# Patient Record
Sex: Female | Born: 1985 | Hispanic: Yes | Marital: Married
Health system: Southern US, Community
[De-identification: ages and names within clinical notes are randomized; demographics above are authoritative.]

---

## 2014-02-19 HISTORY — PX: OTHER SURGICAL HISTORY: SHX169

## 2016-01-31 LAB — OB RESULTS CONSOLE HEPATITIS B SURFACE ANTIGEN: Hepatitis B Surface Ag: NEGATIVE

## 2016-01-31 LAB — OB RESULTS CONSOLE ANTIBODY SCREEN: Antibody Screen: NEGATIVE

## 2016-01-31 LAB — OB RESULTS CONSOLE RUBELLA ANTIBODY, IGM: Rubella: IMMUNE

## 2016-01-31 LAB — OB RESULTS CONSOLE ABO/RH: RH Type: POSITIVE

## 2016-01-31 LAB — OB RESULTS CONSOLE GC/CHLAMYDIA
CHLAMYDIA, DNA PROBE: NEGATIVE
GC PROBE AMP, GENITAL: NEGATIVE

## 2016-01-31 LAB — OB RESULTS CONSOLE HIV ANTIBODY (ROUTINE TESTING): HIV: NONREACTIVE

## 2016-01-31 LAB — OB RESULTS CONSOLE RPR: RPR: NONREACTIVE

## 2016-02-20 NOTE — L&D Delivery Note (Signed)
/   Delivery Note I was called to attend this delivery due to rapid progression.  I arrived just as the baby was being delivered, en caul;At 7:38 AM a viable female was delivered via  (Presentation: ;LOA  ).  APGAR: , ; weight  .pending  After 1 minute, the cord was clamped and cut. 40 units of pitocin diluted in 1000cc LR was infused rapidly IV.  The placenta separated spontaneously and delivered via CCT and maternal pushing effort.  It was inspected and appears to be intact with a 3 VC   Anesthesia:  none Episiotomy:   Lacerations:  none Suture Repair:  Est. Blood Loss (mL):    Mom to postpartum.  Baby to Couplet care / Skin to Skin.  CRESENZO-DISHMAN,Tevyn Codd 08/17/2016, 7:44 AM  .

## 2016-03-30 ENCOUNTER — Other Ambulatory Visit: Payer: Self-pay

## 2016-05-02 ENCOUNTER — Other Ambulatory Visit (HOSPITAL_COMMUNITY): Payer: Self-pay | Admitting: Obstetrics & Gynecology

## 2016-05-02 DIAGNOSIS — Z3A25 25 weeks gestation of pregnancy: Secondary | ICD-10-CM

## 2016-05-02 DIAGNOSIS — Z3689 Encounter for other specified antenatal screening: Secondary | ICD-10-CM

## 2016-05-09 ENCOUNTER — Other Ambulatory Visit (HOSPITAL_COMMUNITY): Payer: Self-pay | Admitting: Obstetrics & Gynecology

## 2016-05-09 ENCOUNTER — Ambulatory Visit (HOSPITAL_COMMUNITY)
Admission: RE | Admit: 2016-05-09 | Discharge: 2016-05-09 | Disposition: A | Discharge status: Home / Self Care | Payer: Medicaid Other | Source: Ambulatory Visit | Attending: Obstetrics & Gynecology | Admitting: Obstetrics & Gynecology

## 2016-05-09 DIAGNOSIS — O99212 Obesity complicating pregnancy, second trimester: Secondary | ICD-10-CM | POA: Diagnosis not present

## 2016-05-09 DIAGNOSIS — O99842 Bariatric surgery status complicating pregnancy, second trimester: Secondary | ICD-10-CM

## 2016-05-09 DIAGNOSIS — Z3A25 25 weeks gestation of pregnancy: Secondary | ICD-10-CM | POA: Diagnosis not present

## 2016-05-09 DIAGNOSIS — Z3689 Encounter for other specified antenatal screening: Secondary | ICD-10-CM

## 2016-05-09 DIAGNOSIS — O09212 Supervision of pregnancy with history of pre-term labor, second trimester: Secondary | ICD-10-CM

## 2016-05-09 IMAGING — US US MFM OB DETAIL+14 WK
1 series · 14 of 28 positions shown · non-contrast
Comparison: none

[Series 1: us mfm ob detail+14 wk · 14 of 86 slices shown]
[im 4/86]
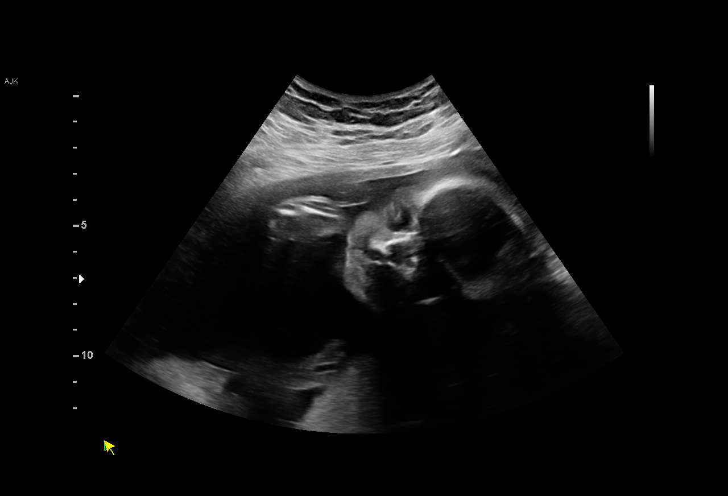
[im 10/86]
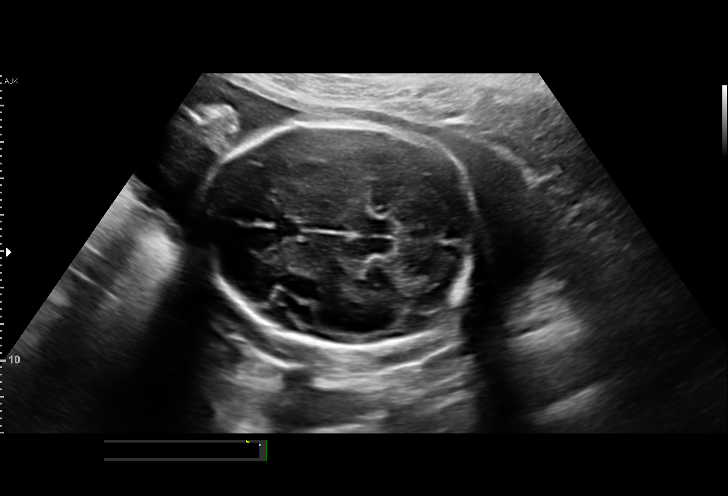
[im 16/86]
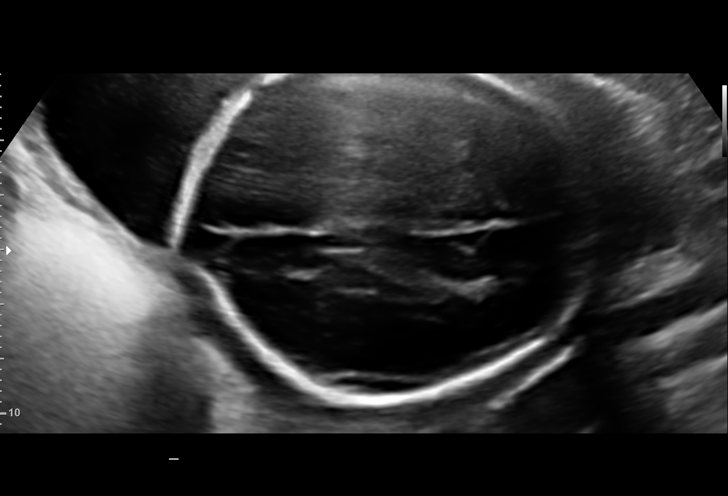
[im 23/86]
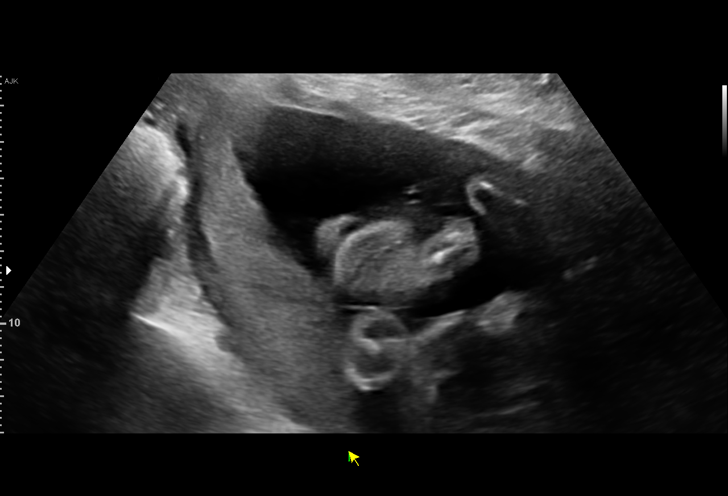
[im 29/86]
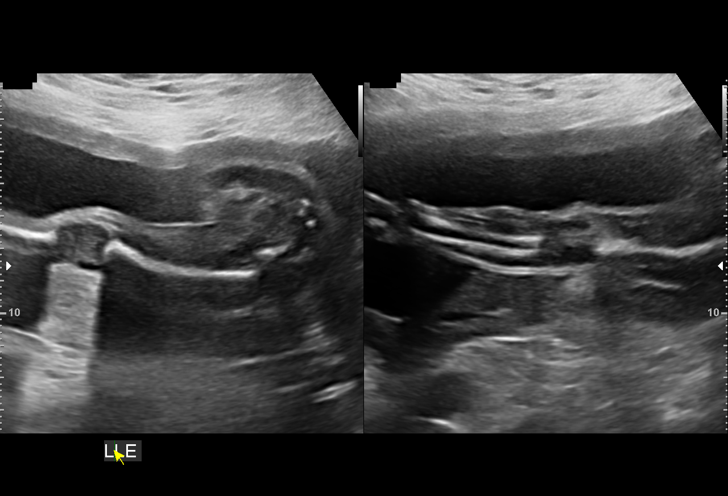
[im 35/86]
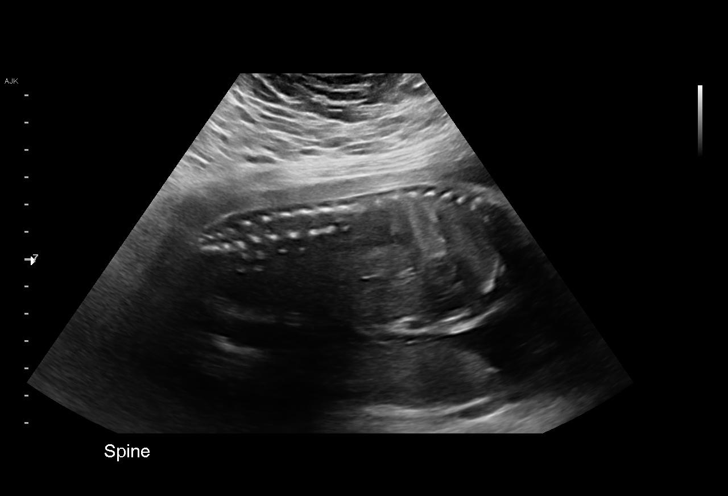
[im 41/86]
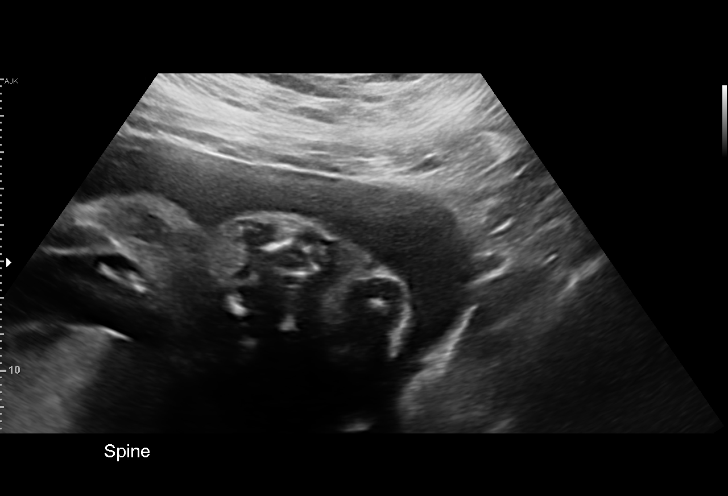
[im 48/86]
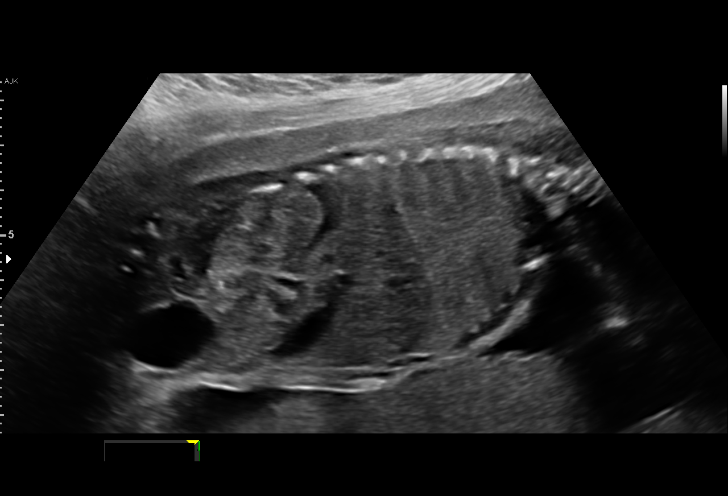
[im 54/86]
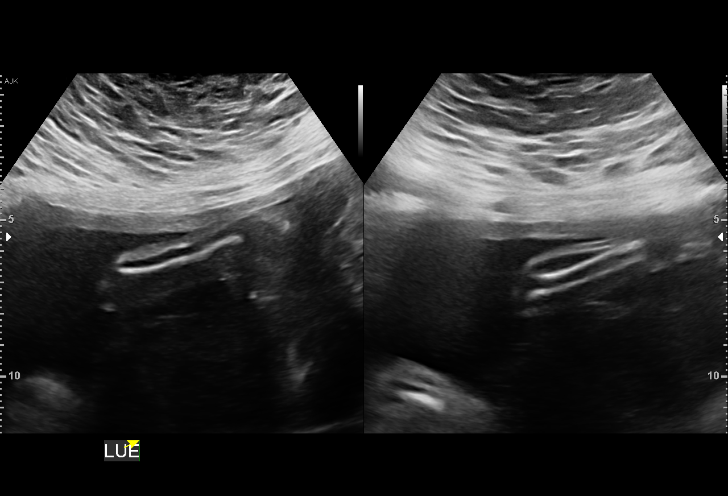
[im 60/86]
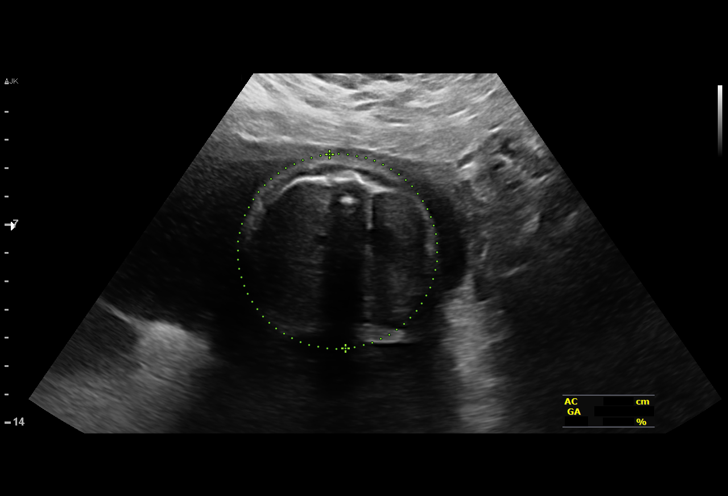
[im 67/86]
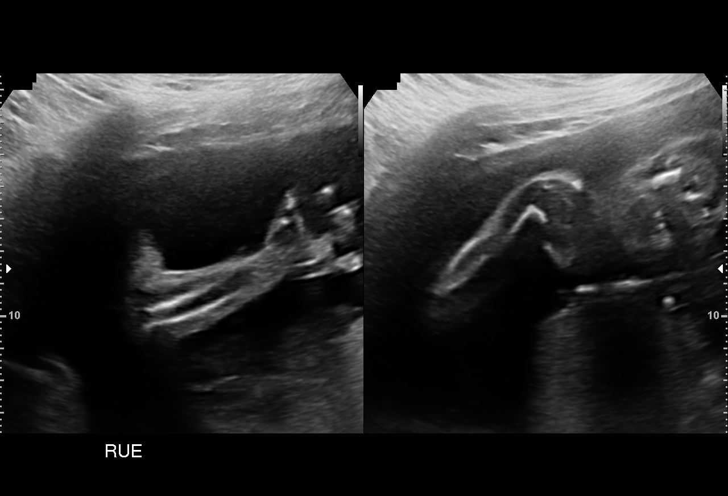
[im 73/86]
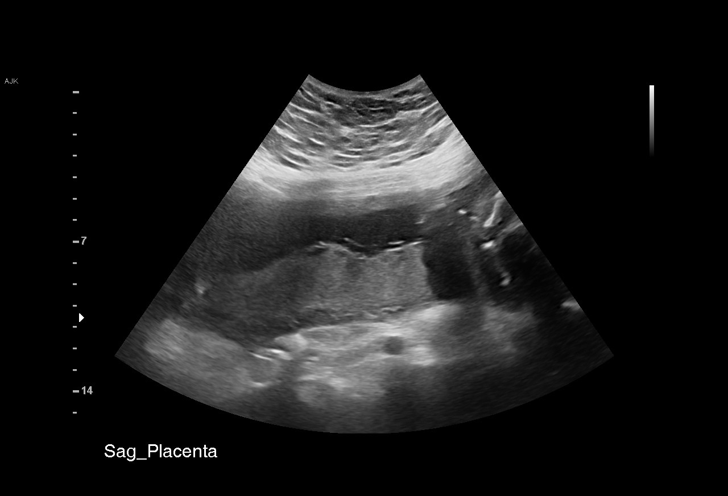
[im 79/86]
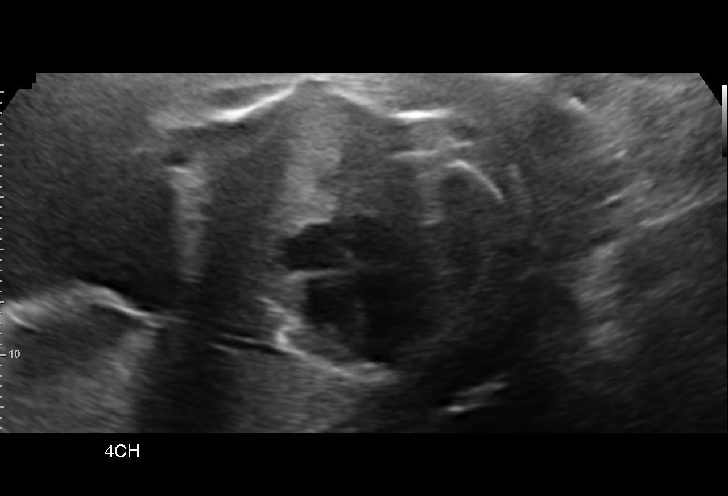
[im 86/86]
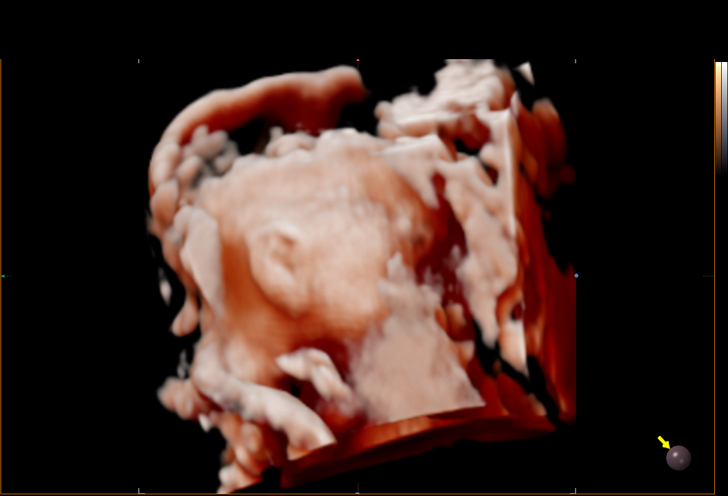

[14 of 28 positions shown; findings below may reference images not displayed]

[WV]

1  YF            [PHONE_NUMBER]      [PHONE_NUMBER]     [PHONE_NUMBER]
Indications

25 weeks gestation of pregnancy
Encounter for fetal anatomic survey            [WV]
Poor obstetric history: Previous preterm       [WV]
delivery, antepartum (36 weeks)
Obesity complicating pregnancy, second         [WV]
trimester
Pregnancy complicated by previous gastric      [WV]
bypass, antepartum, second trimester
OB History

Blood Type:            Height:  5'6"   Weight (lb):  262      BMI:
Gravidity:    5         Term:   1        Prem:   1        SAB:   1
Living:       3
Fetal Evaluation

Num Of Fetuses:     1
Fetal Heart         146
Rate(bpm):
Cardiac Activity:   Observed
Presentation:       Cephalic
Placenta:           Posterior, above cervical os
P. Cord Insertion:  Visualized, central

Amniotic Fluid
AFI FV:      Subjectively within normal limits

Largest Pocket(cm)
3.59
Biometry

BPD:      63.2  mm     G. Age:  25w 4d         31  %    CI:        73.73   %   70 - 86
FL/HC:      20.1   %   18.6 -
HC:      233.8  mm     G. Age:  25w 3d         15  %    HC/AC:      1.08       1.04 -
AC:      216.3  mm     G. Age:  26w 1d         49  %    FL/BPD:     74.2   %   71 - 87
FL:       46.9  mm     G. Age:  25w 5d         29  %    FL/AC:      21.7   %   20 - 24
CER:      32.6  mm     G. Age:  28w 4d         94  %

Est. FW:     859  gm    1 lb 14 oz      54  %
Gestational Age

U/S Today:     25w 5d                                        EDD:   [DATE]
Best:          25w 6d    Det. By:   Previous Ultrasound      EDD:   [DATE]
([DATE])
Anatomy

Cranium:               Appears normal         Aortic Arch:            Appears normal
Cavum:                 Appears normal         Ductal Arch:            Not well visualized
Ventricles:            Appears normal         Diaphragm:              Appears normal
Choroid Plexus:        Appears normal         Stomach:                Appears normal, left
sided
Cerebellum:            Appears normal         Abdomen:                Appears normal
Posterior Fossa:       Appears normal         Abdominal Wall:         Appears nml (cord
insert, abd wall)
Nuchal Fold:           Not applicable (>20    Cord Vessels:           Appears normal (3
wks GA)                                        vessel cord)
Face:                  Appears normal         Kidneys:                Appear normal
(orbits and profile)
Lips:                  Appears normal         Bladder:                Appears normal
Thoracic:              Appears normal         Spine:                  Appears normal
Heart:                 Appears normal         Upper Extremities:      Appears normal
(4CH, axis, and situs
RVOT:                  Not well visualized    Lower Extremities:      Appears normal
LVOT:                  Appears normal

Other:  Fetus appears to be a female. Nasal bone visualized. Technically
difficult due to maternal habitus and fetal position.
Cervix Uterus Adnexa

Cervix
Length:           3.73  cm.
Normal appearance by transabdominal scan.

Uterus
No abnormality visualized.

Left Ovary
Not visualized.

Right Ovary
Not visualized.

Adnexa:       No abnormality visualized.
Impression

SIUP at 25+6 weeks
Normal detailed fetal anatomy; limited views of RVOT and the
DA
Normal amniotic fluid volume
Measurements consistent with a prior US; EFW at the 54th
%tile
Recommendations

Serial ultrasounds for growth
We would be pleased to perform these exams.  If desired,
call to schedule.

## 2016-07-19 LAB — OB RESULTS CONSOLE GBS: STREP GROUP B AG: NEGATIVE

## 2016-08-06 ENCOUNTER — Other Ambulatory Visit: Payer: Self-pay | Admitting: Obstetrics & Gynecology

## 2016-08-07 ENCOUNTER — Encounter (HOSPITAL_COMMUNITY): Payer: Self-pay | Admitting: *Deleted

## 2016-08-07 ENCOUNTER — Telehealth (HOSPITAL_COMMUNITY): Payer: Self-pay | Admitting: *Deleted

## 2016-08-07 NOTE — Telephone Encounter (Signed)
Preadmission screen  

## 2016-08-13 ENCOUNTER — Inpatient Hospital Stay (HOSPITAL_COMMUNITY): Admission: RE | Admit: 2016-08-13 | Payer: Medicaid Other | Source: Ambulatory Visit

## 2016-08-16 DIAGNOSIS — Z3A40 40 weeks gestation of pregnancy: Secondary | ICD-10-CM

## 2016-08-17 ENCOUNTER — Encounter (HOSPITAL_COMMUNITY): Payer: Self-pay

## 2016-08-17 ENCOUNTER — Inpatient Hospital Stay (HOSPITAL_COMMUNITY)
Admission: AD | Admit: 2016-08-17 | Discharge: 2016-08-18 | DRG: 775 | Disposition: A | Discharge status: Home / Self Care | Payer: Medicaid Other | Source: Ambulatory Visit | Attending: Obstetrics & Gynecology | Admitting: Obstetrics & Gynecology

## 2016-08-17 DIAGNOSIS — Z3493 Encounter for supervision of normal pregnancy, unspecified, third trimester: Secondary | ICD-10-CM | POA: Diagnosis present

## 2016-08-17 DIAGNOSIS — Z3A39 39 weeks gestation of pregnancy: Secondary | ICD-10-CM

## 2016-08-17 DIAGNOSIS — Z3A40 40 weeks gestation of pregnancy: Secondary | ICD-10-CM | POA: Diagnosis not present

## 2016-08-17 LAB — CBC
HCT: 36.6 % (ref 36.0–46.0)
HEMOGLOBIN: 12.4 g/dL (ref 12.0–15.0)
MCH: 30.2 pg (ref 26.0–34.0)
MCHC: 33.9 g/dL (ref 30.0–36.0)
MCV: 89.1 fL (ref 78.0–100.0)
PLATELETS: 278 10*3/uL (ref 150–400)
RBC: 4.11 MIL/uL (ref 3.87–5.11)
RDW: 13.1 % (ref 11.5–15.5)
WBC: 11.1 10*3/uL — AB (ref 4.0–10.5)

## 2016-08-17 LAB — ABO/RH: ABO/RH(D): O POS

## 2016-08-17 LAB — TYPE AND SCREEN
ABO/RH(D): O POS
ANTIBODY SCREEN: NEGATIVE

## 2016-08-17 LAB — RPR: RPR: NONREACTIVE

## 2016-08-17 MED ORDER — SIMETHICONE 80 MG PO CHEW: 80.0000 mg | CHEWABLE_TABLET | ORAL | Status: DC | PRN

## 2016-08-17 MED ORDER — BENZOCAINE-MENTHOL 20-0.5 % EX AERO
1.0000 | INHALATION_SPRAY | CUTANEOUS | Status: DC | PRN
Start: 2016-08-17 — End: 2016-08-18

## 2016-08-17 MED ORDER — ONDANSETRON HCL 4 MG/2ML IJ SOLN: 4.0000 mg | INTRAMUSCULAR | Status: DC | PRN

## 2016-08-17 MED ORDER — OXYTOCIN BOLUS FROM INFUSION
500.0000 mL | Freq: Once | INTRAVENOUS | Status: AC
  Administered 2016-08-17: 500 mL via INTRAVENOUS

## 2016-08-17 MED ORDER — PRENATAL MULTIVITAMIN CH
1.0000 | ORAL_TABLET | Freq: Every day | ORAL | Status: DC
  Administered 2016-08-17 – 2016-08-18 (×2): 1 via ORAL
  Filled 2016-08-17 (×2): qty 1

## 2016-08-17 MED ORDER — COCONUT OIL OIL: 1.0000 "application " | TOPICAL_OIL | Status: DC | PRN

## 2016-08-17 MED ORDER — DIPHENHYDRAMINE HCL 25 MG PO CAPS: 25.0000 mg | ORAL_CAPSULE | Freq: Four times a day (QID) | ORAL | Status: DC | PRN

## 2016-08-17 MED ORDER — LIDOCAINE HCL (PF) 1 % IJ SOLN
30.0000 mL | INTRAMUSCULAR | Status: DC | PRN
Start: 2016-08-17 — End: 2016-08-18
  Filled 2016-08-17: qty 30

## 2016-08-17 MED ORDER — LACTATED RINGERS IV SOLN
INTRAVENOUS | Status: DC
  Administered 2016-08-17: 01:00:00 via INTRAVENOUS

## 2016-08-17 MED ORDER — OXYCODONE-ACETAMINOPHEN 5-325 MG PO TABS
2.0000 | ORAL_TABLET | ORAL | Status: DC | PRN
  Administered 2016-08-17 – 2016-08-18 (×5): 2 via ORAL
  Filled 2016-08-17 (×6): qty 2

## 2016-08-17 MED ORDER — OXYTOCIN 40 UNITS IN LACTATED RINGERS INFUSION - SIMPLE MED
2.5000 [IU]/h | INTRAVENOUS | Status: DC
  Filled 2016-08-17: qty 1000

## 2016-08-17 MED ORDER — OXYCODONE-ACETAMINOPHEN 5-325 MG PO TABS
1.0000 | ORAL_TABLET | ORAL | Status: DC | PRN
  Filled 2016-08-17: qty 1

## 2016-08-17 MED ORDER — DIBUCAINE 1 % RE OINT: 1.0000 "application " | TOPICAL_OINTMENT | RECTAL | Status: DC | PRN

## 2016-08-17 MED ORDER — ONDANSETRON HCL 4 MG PO TABS: 4.0000 mg | ORAL_TABLET | ORAL | Status: DC | PRN

## 2016-08-17 MED ORDER — ZOLPIDEM TARTRATE 5 MG PO TABS: 5.0000 mg | ORAL_TABLET | Freq: Every evening | ORAL | Status: DC | PRN

## 2016-08-17 MED ORDER — LACTATED RINGERS IV SOLN: 500.0000 mL | INTRAVENOUS | Status: DC | PRN

## 2016-08-17 MED ORDER — TERBUTALINE SULFATE 1 MG/ML IJ SOLN
0.2500 mg | Freq: Once | INTRAMUSCULAR | Status: DC | PRN
Start: 2016-08-17 — End: 2016-08-18
  Filled 2016-08-17: qty 1

## 2016-08-17 MED ORDER — TETANUS-DIPHTH-ACELL PERTUSSIS 5-2.5-18.5 LF-MCG/0.5 IM SUSP: 0.5000 mL | Freq: Once | INTRAMUSCULAR | Status: DC

## 2016-08-17 MED ORDER — SOD CITRATE-CITRIC ACID 500-334 MG/5ML PO SOLN: 30.0000 mL | ORAL | Status: DC | PRN

## 2016-08-17 MED ORDER — SENNOSIDES-DOCUSATE SODIUM 8.6-50 MG PO TABS
2.0000 | ORAL_TABLET | ORAL | Status: DC
  Administered 2016-08-17: 2 via ORAL
  Filled 2016-08-17: qty 2

## 2016-08-17 MED ORDER — ACETAMINOPHEN 325 MG PO TABS: 650.0000 mg | ORAL_TABLET | ORAL | Status: DC | PRN

## 2016-08-17 MED ORDER — BUTORPHANOL TARTRATE 1 MG/ML IJ SOLN
INTRAMUSCULAR | Status: AC
  Filled 2016-08-17: qty 1

## 2016-08-17 MED ORDER — ONDANSETRON HCL 4 MG/2ML IJ SOLN: 4.0000 mg | Freq: Four times a day (QID) | INTRAMUSCULAR | Status: DC | PRN

## 2016-08-17 MED ORDER — OXYTOCIN 40 UNITS IN LACTATED RINGERS INFUSION - SIMPLE MED
1.0000 m[IU]/min | INTRAVENOUS | Status: DC
  Administered 2016-08-17: 2 m[IU]/min via INTRAVENOUS
  Filled 2016-08-17: qty 1000

## 2016-08-17 MED ORDER — BUTORPHANOL TARTRATE 1 MG/ML IJ SOLN
1.0000 mg | INTRAMUSCULAR | Status: DC | PRN
  Administered 2016-08-17 (×2): 1 mg via INTRAVENOUS
  Filled 2016-08-17: qty 1

## 2016-08-17 MED ORDER — WITCH HAZEL-GLYCERIN EX PADS: 1.0000 "application " | MEDICATED_PAD | CUTANEOUS | Status: DC | PRN

## 2016-08-17 MED ORDER — MISOPROSTOL 25 MCG QUARTER TABLET
25.0000 ug | ORAL_TABLET | ORAL | Status: DC
  Filled 2016-08-17 (×7): qty 1

## 2016-08-17 NOTE — Lactation Note (Signed)
This note was copied from a baby's chart. Lactation Consultation Note  Patient Name: Debra Pineda Today's Date: 08/17/2016 Reason for consult: Initial assessment  Visited with 3rd time Mom, baby 7 hrs old.  Baby has latched and fed 3 times since birth.  Mom holding a bottle of formula, as [facility] was wanting her to give baby formula to give Mom a break.  Room is full of family.  LC discussed importance of exclusive breastfeeding for a few weeks before introducing bottles, unless medically indicated to supplement.  Mom describes a wide, deep latch, and complains of severe uterine contractions during the feeding.  Offered a latch assist, as baby is showing cues while in arms of family member.  Mom choosing to offer formula.  Recommended she offer 5-227ml only after baby has breastfed.  Mom talked about pumping, LC offered to set up double pump, but Mom declined.  Described pace bottle feeding with Mom, as this is more like breastmilk flow from breast. Recommended feeding baby often STS, when baby cues.   Brochure left with Mom.  Informed Mom of OP lactation services available to her. Encouraged her to call prn for assistance with breastfeeding.  Lactation to follow up in am.     Consult Status Consult Status: Follow-up Date: 08/18/16 Follow-up type: In-patient    Judee ClaraSmith, Suzette Flagler E 08/17/2016, 2:47 PM

## 2016-08-17 NOTE — Progress Notes (Signed)
Patient precipitously delivered, I missed the delivery She was delivered by faculty practice w/o event Mother and baby are doing well  Debra Pineda, Debra Pineda

## 2016-08-17 NOTE — H&P (Signed)
Debra Pineda is a 31 y.o. female G4P2 at 40 weeks presenting for induction of labor at term. Normal fetal movement, no leaking of fluid, no vaginal bleeding, occasional ctx.   OB History    Gravida Para Term Preterm AB Living   4 3 2 1 1 3    SAB TAB Ectopic Multiple Live Births   1     0 3     History reviewed. No pertinent past medical history. Past Surgical History:  Procedure Laterality Date  . GI-weight loss  2016   Family History: family history is not on file. Social History:  has no tobacco, alcohol, and drug history on file.     Maternal Diabetes: No Genetic Screening: Normal Maternal Ultrasounds/Referrals: Normal Fetal Ultrasounds or other Referrals:  None Maternal Substance Abuse:  No Significant Maternal Medications:  None Significant Maternal Lab Results:  Lab values include: Group B Strep negative Other Comments:  None  ROS History Dilation: 10 Effacement (%): 70 Station: -2 Exam by:: Debra Ipatherine Stout RN Blood pressure 90/62, pulse (!) 55, temperature 98.6 F (37 C), temperature source Oral, resp. rate 18, height 5\' 6"  (1.676 m), weight 127.9 kg (282 lb), unknown if currently breastfeeding. Exam Physical Exam  Prenatal labs: ABO, Rh: --/--/O POS, O POS (06/29 0045) Antibody: NEG (06/29 0045) Rubella: Immune (12/12 0000) RPR: Nonreactive (12/12 0000)  HBsAg: Negative (12/12 0000)  HIV: Non-reactive (12/12 0000)  GBS: Negative (05/31 0000)   Assessment/Plan: 31 yo P2 at 40 weeks for induction of labor Pitocin for induction  Epidural on demand  Continuous monitoring    Debra Pineda 08/17/2016, 9:33 AM

## 2016-08-17 NOTE — Progress Notes (Signed)
UR chart review completed.  

## 2016-08-18 LAB — CBC
HEMATOCRIT: 34.8 % — AB (ref 36.0–46.0)
HEMOGLOBIN: 11.5 g/dL — AB (ref 12.0–15.0)
MCH: 29.9 pg (ref 26.0–34.0)
MCHC: 33 g/dL (ref 30.0–36.0)
MCV: 90.4 fL (ref 78.0–100.0)
Platelets: 244 10*3/uL (ref 150–400)
RBC: 3.85 MIL/uL — ABNORMAL LOW (ref 3.87–5.11)
RDW: 13.2 % (ref 11.5–15.5)
WBC: 10.5 10*3/uL (ref 4.0–10.5)

## 2016-08-18 MED ORDER — OXYCODONE-ACETAMINOPHEN 5-325 MG PO TABS: 1.0000 | ORAL_TABLET | ORAL | 0 refills | Status: AC | PRN

## 2016-08-18 NOTE — Discharge Summary (Signed)
Obstetric Discharge Summary Reason for Admission: induction of labor Prenatal Procedures: NST and ultrasound Intrapartum Procedures: spontaneous vaginal delivery Postpartum Procedures: none Complications-Operative and Postpartum: none Hemoglobin  Date Value Ref Range Status  08/18/2016 11.5 (L) 12.0 - 15.0 g/dL Final   HCT  Date Value Ref Range Status  08/18/2016 34.8 (L) 36.0 - 46.0 % Final    Physical Exam:  General: alert, cooperative and no distress Lochia: appropriate Uterine Fundus: firm perineum: healing well, no significant drainage, no significant erythema DVT Evaluation: No evidence of DVT seen on physical exam. Negative Homan's sign. No cords or calf tenderness. No significant calf/ankle edema. Discharge Diagnoses: Term Pregnancy-delivered  Discharge Information: Date: 08/18/2016 Activity: pelvic rest Diet: routine Medications: PNV, Colace and Percocet Condition: stable Instructions: refer to practice specific booklet Discharge to: home   Newborn Data: Live born female  Birth Weight: 6 lb 14.9 oz (3145 g) APGAR: 8, 9  Home with mother.  Essie HartINN, Letita Prentiss STACIA 08/18/2016, 8:13 AM

## 2016-08-18 NOTE — Progress Notes (Signed)
Post Partum Day 1 Subjective: no complaints, up ad lib, voiding, tolerating PO, + flatus and breast feeding  Objective: Blood pressure 96/62, pulse (!) 53, temperature 98 F (36.7 C), temperature source Oral, resp. rate 18, height 5\' 6"  (1.676 m), weight 127.9 kg (282 lb), unknown if currently breastfeeding.  Physical Exam:  General: alert, cooperative and no distress Lochia: appropriate Uterine Fundus: firm perineum: healing well, no significant drainage, no significant erythema DVT Evaluation: No evidence of DVT seen on physical exam. Negative Homan's sign. No cords or calf tenderness. No significant calf/ankle edema.   Recent Labs  08/17/16 0045 08/18/16 0511  HGB 12.4 11.5*  HCT 36.6 34.8*    Assessment/Plan: Discharge home, Breastfeeding and Contraception will discuss at post partum visit   LOS: 1 day   Desira Alessandrini STACIA 08/18/2016, 8:10 AM

## 2016-08-18 NOTE — Lactation Note (Signed)
This note was copied from a baby's chart. Lactation Consultation Note  Patient Name: Debra Pineda OZHYQ'MToday's Date: 08/18/2016 Reason for consult: Follow-up assessment Baby now 25 hours old and starting to cluster feed. Mom latching baby in cradle hold and reports some discomfort with latch. LC assisted Mom with positioning to obtain more depth with latch and reviewed un-tucking lips for more depth. Advised baby should be at breast 8-12 times in 24 hours and with feeding ques. Advised to apply EBM/coconut oil to nipples if they become tender.  Engorgement care reviewed if needed. Advised of OP services and support group. Encouraged to call for question/concerns.  Maternal Data    Feeding Feeding Type: Breast Fed Length of feed: 10 min  LATCH Score/Interventions Latch: Grasps breast easily, tongue down, lips flanged, rhythmical sucking.  Audible Swallowing: A few with stimulation Intervention(s): Skin to skin  Type of Nipple: Everted at rest and after stimulation  Comfort (Breast/Nipple): Soft / non-tender     Hold (Positioning): Assistance needed to correctly position infant at breast and maintain latch. Intervention(s): Breastfeeding basics reviewed;Support Pillows;Position options;Skin to skin  LATCH Score: 8  Lactation Tools Discussed/Used     Consult Status Consult Status: Complete Date: 08/18/16 Follow-up type: In-patient    Alfred LevinsGranger, [name] 08/18/2016, 9:27 AM
